# Patient Record
Sex: Female | Born: 1985 | Hispanic: Yes | Marital: Married | Smoking: Never smoker
Health system: Southern US, Community
[De-identification: ages and names within clinical notes are randomized; demographics above are authoritative.]

## PROBLEM LIST (undated history)

## (undated) DIAGNOSIS — Z789 Other specified health status: Secondary | ICD-10-CM

## (undated) HISTORY — PX: NO PAST SURGERIES: SHX2092

---

## 2015-11-11 ENCOUNTER — Other Ambulatory Visit (HOSPITAL_COMMUNITY): Payer: Self-pay | Admitting: Obstetrics and Gynecology

## 2015-11-11 DIAGNOSIS — Z3689 Encounter for other specified antenatal screening: Secondary | ICD-10-CM

## 2015-11-11 DIAGNOSIS — Z3A19 19 weeks gestation of pregnancy: Secondary | ICD-10-CM

## 2015-11-20 ENCOUNTER — Encounter (HOSPITAL_COMMUNITY): Payer: Self-pay | Admitting: *Deleted

## 2015-11-21 ENCOUNTER — Encounter (HOSPITAL_COMMUNITY): Payer: Self-pay

## 2015-11-21 ENCOUNTER — Other Ambulatory Visit (HOSPITAL_COMMUNITY): Payer: Self-pay | Admitting: Obstetrics and Gynecology

## 2015-11-21 ENCOUNTER — Ambulatory Visit (HOSPITAL_COMMUNITY)
Admission: RE | Admit: 2015-11-21 | Discharge: 2015-11-21 | Disposition: A | Discharge status: Home / Self Care | Payer: Medicaid Other | Source: Ambulatory Visit | Attending: Obstetrics and Gynecology | Admitting: Obstetrics and Gynecology

## 2015-11-21 DIAGNOSIS — Z3A20 20 weeks gestation of pregnancy: Secondary | ICD-10-CM | POA: Insufficient documentation

## 2015-11-21 DIAGNOSIS — Z363 Encounter for antenatal screening for malformations: Secondary | ICD-10-CM | POA: Diagnosis not present

## 2015-11-21 DIAGNOSIS — Z3A19 19 weeks gestation of pregnancy: Secondary | ICD-10-CM

## 2015-11-21 DIAGNOSIS — O283 Abnormal ultrasonic finding on antenatal screening of mother: Secondary | ICD-10-CM | POA: Insufficient documentation

## 2015-11-21 DIAGNOSIS — Z3689 Encounter for other specified antenatal screening: Secondary | ICD-10-CM

## 2015-11-21 DIAGNOSIS — O43122 Velamentous insertion of umbilical cord, second trimester: Secondary | ICD-10-CM

## 2015-11-21 HISTORY — DX: Other specified health status: Z78.9

## 2015-11-21 NOTE — Progress Notes (Signed)
Maternal Fetal Medicine Consultation  Requesting Provider(s): Janalyn RouseWright Nguyen  Primary Ob: Same Reason for consultation: Suspected vasa previa  HPI: 30yo [id number] at 20+1 weeks with US on 11/6 suspicious for vasa previa. Our US today confirms a high likelihood of vasa previa, although funic presentation could not be ruled out entirely. This pregnancy has been unremarkable so far. She has been placed on pelvic rest. She has had no contractions or vaginal bleeding. He dating is from a 13 week CRL, but there was a weeks discrepancy on her US on 11/6 OB History: OB History    Gravida Para Term Preterm AB Living   4 3 3     3    SAB TAB Ectopic Multiple Live Births                All three of her deliveries were uncomplicated vaginal deliveries, but her last baby weighed much less than her first two (5 versus 7 pounds)  PMH:  Past Medical History:  Diagnosis Date  . Medical history non-contributory     PSH:  Past Surgical History:  Procedure Laterality Date  . NO PAST SURGERIES     Meds: PNV Allergies: NKDA Fh: unremarkable Soc: denies tobacco, ETOH and illicit drug use  Review of Systems: no vaginal bleeding or cramping/contractions, no LOF, no nausea/vomiting. All other systems reviewed and are negative.   PE:  VS: See EPI entry GEN: well-appearing female ABD: gravid, NT  Please see separate document for fetal ultrasound report.  A/P: This patient appears to have a baby with vasa previa. I cannot be completely certain, but I have asked her to return in 4 weeks for a repeat scan. At that time we will try some positional changes during the exam to dislodge a funic presentation. If we cannot completely rule out vasa previa at that exam and on an exam at 28 weeks, we will proceed with treatment for vasa previa. This is a fairly intensive regimen, and I discussed this at length with the patient and her partner. This includes: 1. Betamethasone course at 28 weeks 2. Twice to three-times  weekly NST (not BPP) starting at 30 weeks (babies with vasa previa are at high risk for terminal cord compression) 3. Inpatient admission at 32 weeks for thrice-daily EFM 4. Delivery by cesarean at 35 weeks after booster betamethasone This may be altered based on cervical length and maternal clinical course.  Thank you for the opportunity to be a part of the care of Anna Newman. Please contact our office if we can be of further assistance.   I spent approximately 30 minutes with this patient with over 50% of time spent in face-to-face counseling.

## 2015-11-24 ENCOUNTER — Other Ambulatory Visit (HOSPITAL_COMMUNITY): Payer: Self-pay

## 2015-11-28 ENCOUNTER — Encounter (HOSPITAL_COMMUNITY): Payer: Self-pay

## 2015-12-24 ENCOUNTER — Encounter (HOSPITAL_COMMUNITY): Payer: Self-pay

## 2015-12-24 ENCOUNTER — Other Ambulatory Visit (HOSPITAL_COMMUNITY): Payer: Self-pay | Admitting: *Deleted

## 2015-12-24 ENCOUNTER — Ambulatory Visit (HOSPITAL_COMMUNITY)
Admission: RE | Admit: 2015-12-24 | Discharge: 2015-12-24 | Disposition: A | Discharge status: Home / Self Care | Payer: Medicaid Other | Source: Ambulatory Visit | Attending: Obstetrics and Gynecology | Admitting: Obstetrics and Gynecology

## 2015-12-24 ENCOUNTER — Other Ambulatory Visit (HOSPITAL_COMMUNITY): Payer: Self-pay | Admitting: Obstetrics and Gynecology

## 2015-12-24 DIAGNOSIS — Z3A24 24 weeks gestation of pregnancy: Secondary | ICD-10-CM | POA: Insufficient documentation

## 2015-12-24 DIAGNOSIS — O283 Abnormal ultrasonic finding on antenatal screening of mother: Secondary | ICD-10-CM | POA: Diagnosis present

## 2015-12-24 DIAGNOSIS — O43122 Velamentous insertion of umbilical cord, second trimester: Secondary | ICD-10-CM | POA: Insufficient documentation

## 2015-12-24 DIAGNOSIS — O43102 Malformation of placenta, unspecified, second trimester: Secondary | ICD-10-CM

## 2016-01-21 ENCOUNTER — Ambulatory Visit (HOSPITAL_COMMUNITY)
Admission: RE | Admit: 2016-01-21 | Discharge: 2016-01-21 | Disposition: A | Discharge status: Home / Self Care | Payer: Medicaid Other | Source: Ambulatory Visit | Attending: Obstetrics and Gynecology | Admitting: Obstetrics and Gynecology

## 2016-01-27 ENCOUNTER — Encounter (HOSPITAL_COMMUNITY): Payer: Self-pay

## 2016-01-27 ENCOUNTER — Ambulatory Visit (HOSPITAL_COMMUNITY)
Admission: RE | Admit: 2016-01-27 | Discharge: 2016-01-27 | Disposition: A | Discharge status: Home / Self Care | Payer: Medicaid Other | Source: Ambulatory Visit | Attending: Obstetrics and Gynecology | Admitting: Obstetrics and Gynecology

## 2016-01-27 ENCOUNTER — Other Ambulatory Visit (HOSPITAL_COMMUNITY): Payer: Self-pay | Admitting: Obstetrics and Gynecology

## 2016-01-27 VITALS — BP 109/60 | HR 79 | Wt 136.4 lb

## 2016-01-27 DIAGNOSIS — Z3A29 29 weeks gestation of pregnancy: Secondary | ICD-10-CM | POA: Insufficient documentation

## 2016-01-27 DIAGNOSIS — O43123 Velamentous insertion of umbilical cord, third trimester: Secondary | ICD-10-CM

## 2016-01-27 DIAGNOSIS — O283 Abnormal ultrasonic finding on antenatal screening of mother: Secondary | ICD-10-CM | POA: Diagnosis not present

## 2016-01-27 DIAGNOSIS — O43102 Malformation of placenta, unspecified, second trimester: Secondary | ICD-10-CM

## 2016-01-27 DIAGNOSIS — O43122 Velamentous insertion of umbilical cord, second trimester: Secondary | ICD-10-CM

## 2016-03-09 ENCOUNTER — Other Ambulatory Visit (HOSPITAL_COMMUNITY): Payer: Self-pay | Admitting: Obstetrics and Gynecology

## 2016-03-09 ENCOUNTER — Ambulatory Visit (HOSPITAL_COMMUNITY)
Admission: RE | Admit: 2016-03-09 | Discharge: 2016-03-09 | Disposition: A | Discharge status: Home / Self Care | Payer: Medicaid Other | Source: Ambulatory Visit | Attending: Obstetrics and Gynecology | Admitting: Obstetrics and Gynecology

## 2016-03-09 ENCOUNTER — Encounter (HOSPITAL_COMMUNITY): Payer: Self-pay

## 2016-03-09 DIAGNOSIS — O43123 Velamentous insertion of umbilical cord, third trimester: Secondary | ICD-10-CM | POA: Insufficient documentation

## 2016-03-09 DIAGNOSIS — O43122 Velamentous insertion of umbilical cord, second trimester: Secondary | ICD-10-CM

## 2016-03-09 DIAGNOSIS — Z3A35 35 weeks gestation of pregnancy: Secondary | ICD-10-CM | POA: Insufficient documentation

## 2016-09-24 ENCOUNTER — Encounter (HOSPITAL_COMMUNITY): Payer: Self-pay

## 2016-12-31 IMAGING — US US MFM OB FOLLOW-UP
1 series · 13 of 28 positions shown · non-contrast
Comparison: none

[Series 1: us mfm ob follow-up · 63 acquisitions, 13 frames shown]
[im 3/63]
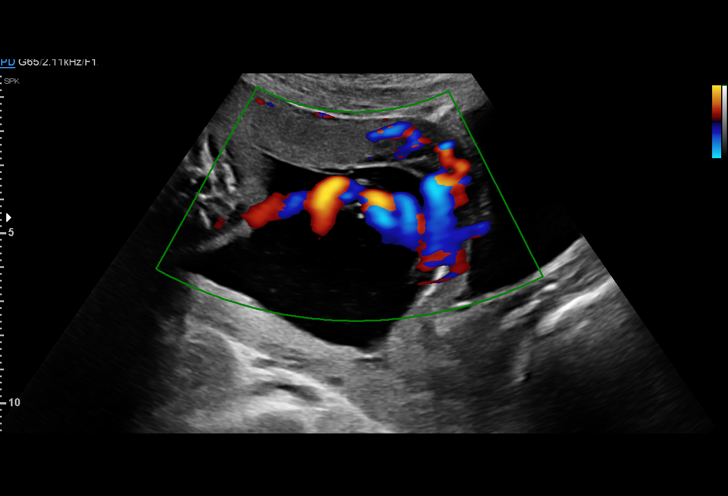
[im 7/63]
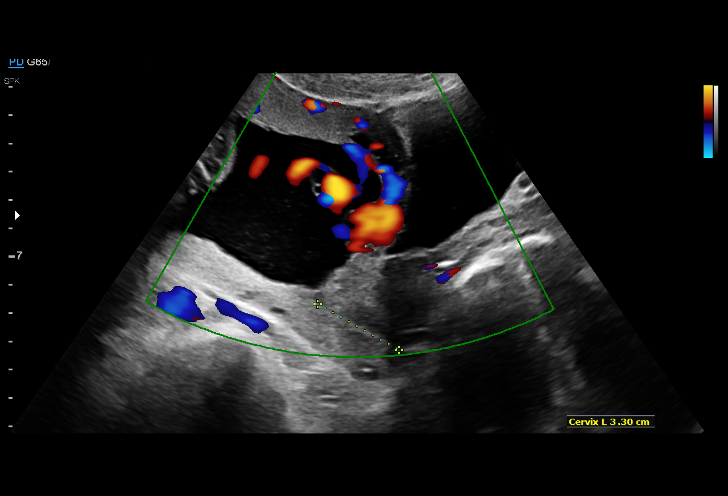
[im 12/63]
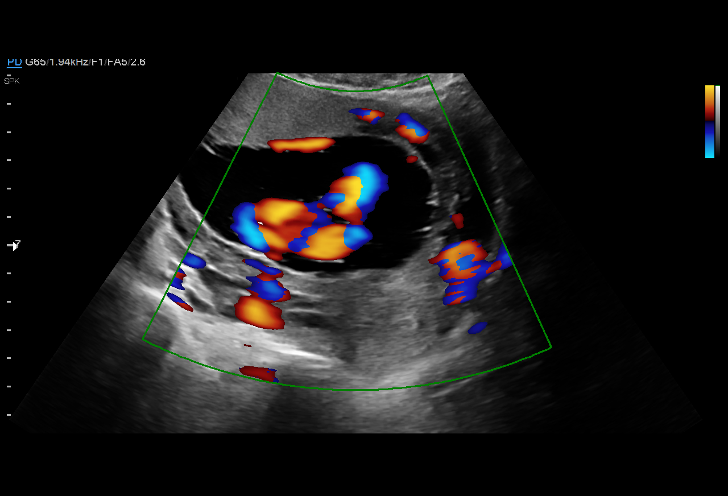
[im 17/63]
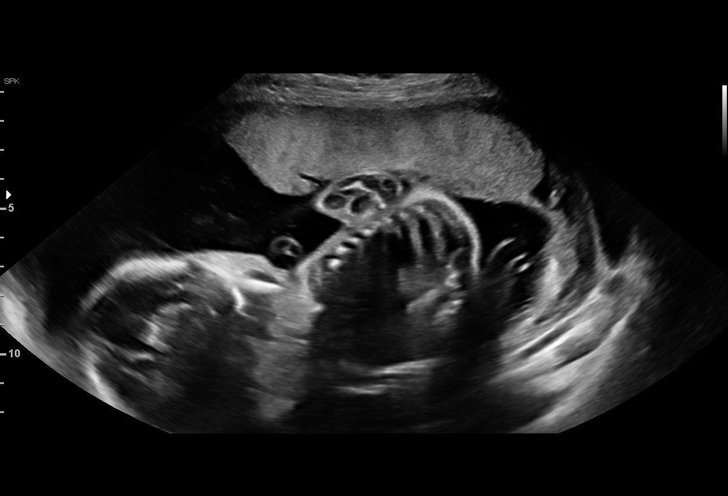
[im 21/63]
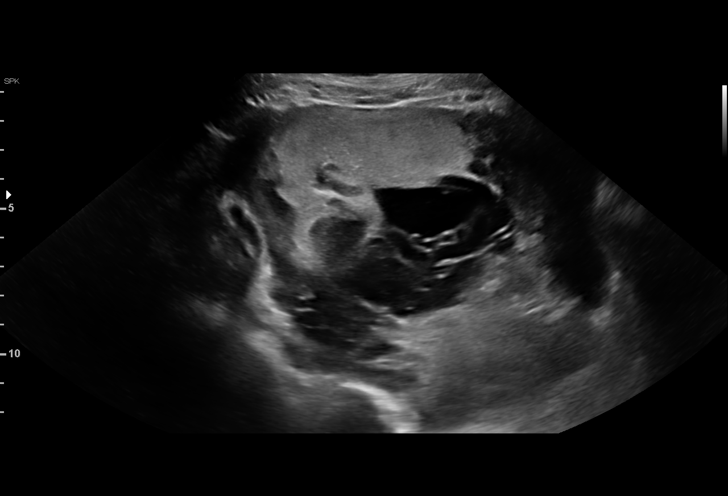
[im 26/63]
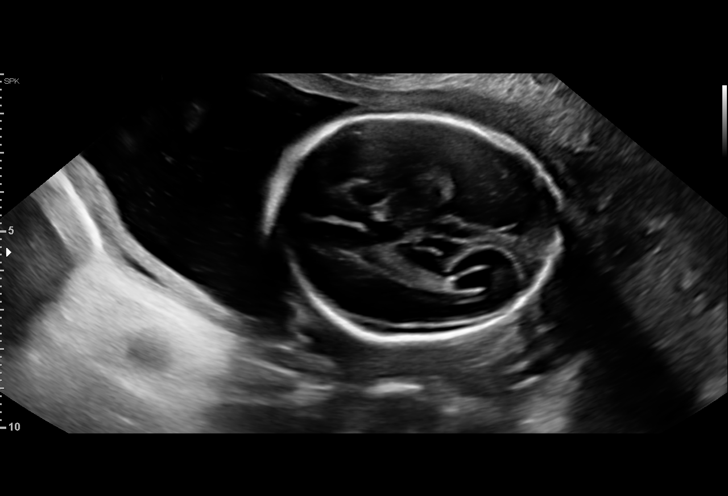
[im 33/63]
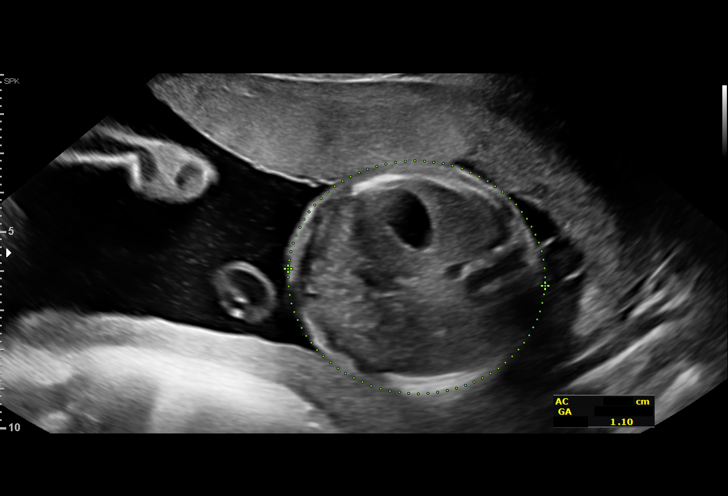
[im 37/63]
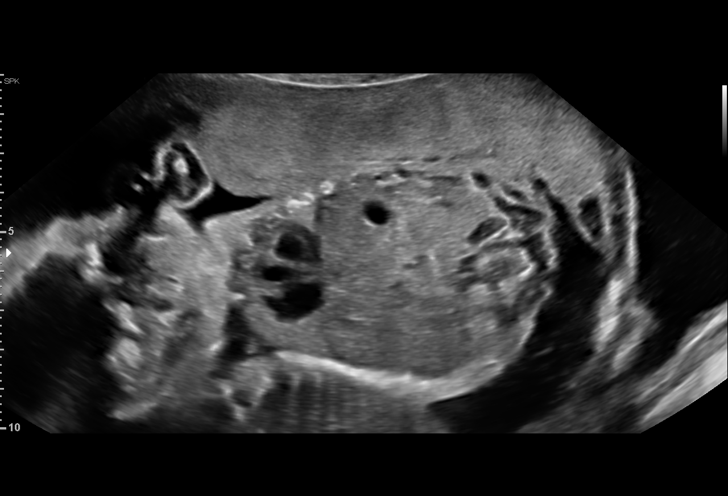
[im 42/63]
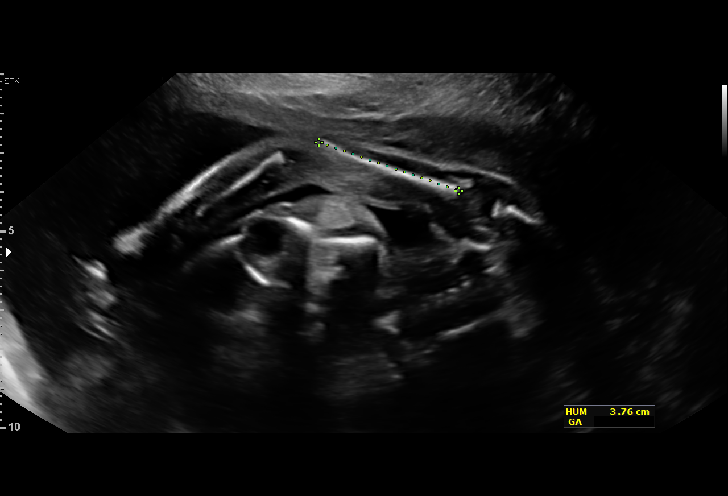
[im 46/63]
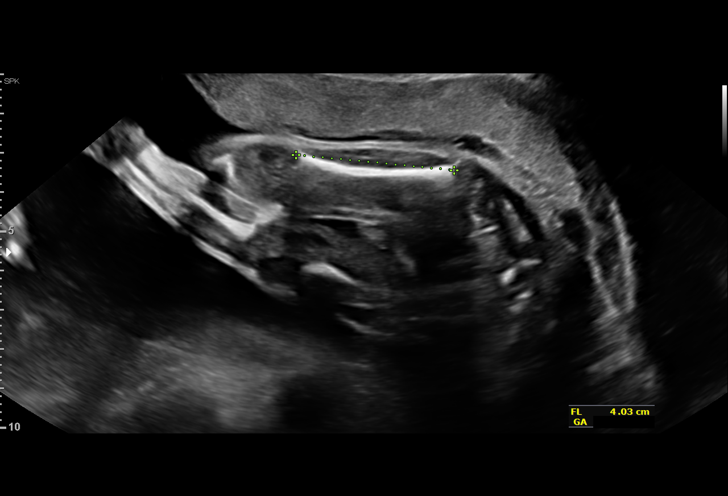
[im 51/63]
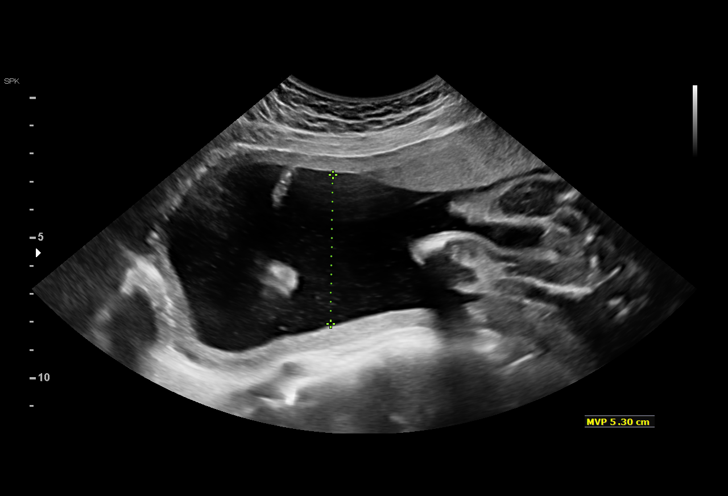
[im 56/63]
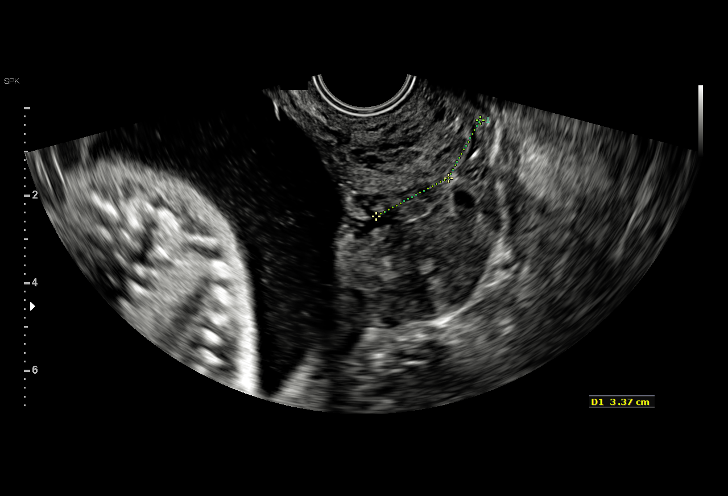
[im 60/63]
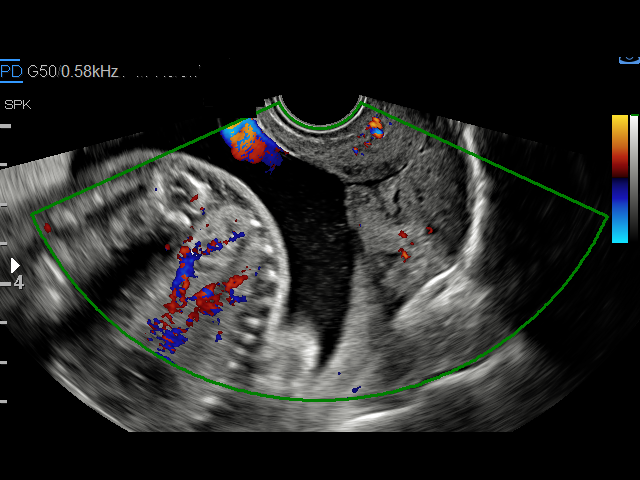

[13 of 28 positions shown; findings below may reference images not displayed]

ZAPARO

FROWEN

1  [employer]              [id number]      [id number]     [id number]
2  [employer]              [id number]      [id number]     [id number]
Indications

24 weeks gestation of pregnancy
Abnormal fetal ultrasound (? vasa previa)
Velamentous insertion of umbilical cord
OB History

Gravidity:    4         Term:   3
Living:       3
Fetal Evaluation

Num Of Fetuses:     1
Fetal Heart         149
Rate(bpm):
Cardiac Activity:   Observed
Presentation:       Breech
Placenta:           Anterior, above cervical os
P. Cord Insertion:  Velamentous insertion

Amniotic Fluid
AFI FV:      Subjectively within normal limits

Largest Pocket(cm)
5.3
Biometry

BPD:      56.1  mm     G. Age:  23w 1d          3  %    CI:        69.18   %   70 - 86
FL/HC:      18.6   %   18.7 -
HC:      215.4  mm     G. Age:  23w 4d          3  %    HC/AC:      1.09       1.04 -
AC:       197   mm     G. Age:  24w 3d         26  %    FL/BPD:     71.5   %   71 - 87
FL:       40.1  mm     G. Age:  23w 0d        < 3  %    FL/AC:      20.4   %   20 - 24
HUM:      38.4  mm     G. Age:  23w 4d         13  %
CER:      24.4  mm     G. Age:  22w 3d        < 5  %
CM:        4.4  mm

Est. FW:     621  gm      1 lb 6 oz     26  %
Gestational Age

LMP:           28w 6d       Date:   06/05/15                 EDD:   03/11/16
U/S Today:     23w 4d                                        EDD:   04/17/16
Best:          24w 6d    Det. By:   Early Ultrasound         EDD:   04/08/16
(10/06/15)
Anatomy

Cranium:               Appears normal         Aortic Arch:            Previously seen
Cavum:                 Appears normal         Ductal Arch:            Previously seen
Ventricles:            Appears normal         Diaphragm:              Appears normal
Choroid Plexus:        Previously seen        Stomach:                Appears normal, left
sided
Cerebellum:            Appears normal         Abdomen:                Appears normal
Posterior Fossa:       Appears normal         Abdominal Wall:         Appears nml (cord
insert, abd wall)
Nuchal Fold:           Not applicable (>20    Cord Vessels:           Appears normal (3
wks GA)                                        vessel cord)
Face:                  Orbits and profile     Kidneys:                Appear normal
previously seen
Lips:                  Previously seen        Bladder:                Appears normal
Thoracic:              Appears normal         Spine:                  Previously seen
Heart:                 Appears normal         Upper Extremities:      Previously seen
(4CH, axis, and
situs)
RVOT:                  Previously seen        Lower Extremities:      Previously seen
LVOT:                  Previously seen

Other:  Fetus appears to be a male. Heels and 5th digit appears normal.
Cervix Uterus Adnexa

Cervix
Length:           3.45  cm.
Normal appearance by transvaginal scan

Uterus
No abnormality visualized.

Left Ovary
Not visualized.

Right Ovary
Not visualized.

Cul De Sac:   No free fluid seen.

Adnexa:       No abnormality visualized.
Impression

Singleton intrauterine pregnancy at 24+6 weeks
Review of the anatomy shows no sonographic markers for
aneuploidy or structural anomalies
Amniotic fluid volume is normal
The anterior placenta has a velamentous cord insertion and
transvaginal images are much less suspicious for vasa previa
than previously. The previous scan probably showed funic
presentation. however, the velementous insertion is still quite
close to the cervix, about 2 cm from the internal os.
Estimated fetal weight is 621g which is growth at the 26th
percentile
Recommendations

Repeat growth and transvaginal evaluation in 4 weks. she
should still remain on pelvic rest, but I am more optimisitic
about the membrane status today
